# Patient Record
Sex: Female | Born: 1955 | Race: White | Hispanic: No | Marital: Married | Smoking: Never smoker
Health system: Southern US, Community
[De-identification: ages and names within clinical notes are randomized; demographics above are authoritative.]

## PROBLEM LIST (undated history)

## (undated) HISTORY — PX: NO PAST SURGERIES: SHX2092

---

## 2009-09-01 ENCOUNTER — Ambulatory Visit: Payer: Self-pay | Admitting: Internal Medicine

## 2010-11-05 DIAGNOSIS — E039 Hypothyroidism, unspecified: Secondary | ICD-10-CM | POA: Insufficient documentation

## 2010-11-05 DIAGNOSIS — F32A Depression, unspecified: Secondary | ICD-10-CM | POA: Insufficient documentation

## 2010-11-05 DIAGNOSIS — E7800 Pure hypercholesterolemia, unspecified: Secondary | ICD-10-CM | POA: Insufficient documentation

## 2010-11-05 DIAGNOSIS — B009 Herpesviral infection, unspecified: Secondary | ICD-10-CM | POA: Insufficient documentation

## 2012-12-31 ENCOUNTER — Ambulatory Visit: Payer: Self-pay | Admitting: Emergency Medicine

## 2013-09-27 DIAGNOSIS — IMO0002 Reserved for concepts with insufficient information to code with codable children: Secondary | ICD-10-CM | POA: Insufficient documentation

## 2013-10-13 DIAGNOSIS — R739 Hyperglycemia, unspecified: Secondary | ICD-10-CM | POA: Insufficient documentation

## 2014-06-22 DIAGNOSIS — Q6672 Congenital pes cavus, left foot: Secondary | ICD-10-CM | POA: Insufficient documentation

## 2014-06-22 DIAGNOSIS — Q6671 Congenital pes cavus, right foot: Secondary | ICD-10-CM | POA: Insufficient documentation

## 2014-06-22 DIAGNOSIS — M722 Plantar fascial fibromatosis: Secondary | ICD-10-CM | POA: Insufficient documentation

## 2014-06-22 DIAGNOSIS — M79673 Pain in unspecified foot: Secondary | ICD-10-CM | POA: Insufficient documentation

## 2014-06-22 DIAGNOSIS — M7751 Other enthesopathy of right foot: Secondary | ICD-10-CM | POA: Insufficient documentation

## 2014-06-22 DIAGNOSIS — L84 Corns and callosities: Secondary | ICD-10-CM | POA: Insufficient documentation

## 2016-05-07 ENCOUNTER — Ambulatory Visit
Admission: EM | Admit: 2016-05-07 | Discharge: 2016-05-07 | Disposition: A | Discharge status: Home / Self Care | Payer: 59 | Attending: Internal Medicine | Admitting: Internal Medicine

## 2016-05-07 DIAGNOSIS — J40 Bronchitis, not specified as acute or chronic: Secondary | ICD-10-CM

## 2016-05-07 MED ORDER — AZITHROMYCIN 250 MG PO TABS: ORAL_TABLET | ORAL | 0 refills | Status: DC

## 2016-05-07 MED ORDER — HYDROCOD POLST-CPM POLST ER 10-8 MG/5ML PO SUER: 5.0000 mL | Freq: Every evening | ORAL | 0 refills | Status: AC | PRN

## 2016-05-07 MED ORDER — ALBUTEROL SULFATE HFA 108 (90 BASE) MCG/ACT IN AERS: 2.0000 | INHALATION_SPRAY | RESPIRATORY_TRACT | 0 refills | Status: AC | PRN

## 2016-05-07 NOTE — Discharge Instructions (Signed)
Take medication as prescribed. Rest. Drink plenty of fluids.  ° °Follow up with your primary care physician this week. Return to Urgent care for new or worsening concerns.  ° °

## 2016-05-07 NOTE — ED Triage Notes (Signed)
Patient complains of cough x 2 weeks. Patient states that she has also noticed some headaches with sinus pain and pressure.Patient states that she has also noticed some wheezing.

## 2016-05-07 NOTE — ED Provider Notes (Signed)
[facility] ____________________________________________  Time seen: Approximately 9:09 AM  I have reviewed the triage vital signs and the nursing notes.   HISTORY  Chief Complaint Cough   HPI Elizabeth Harrell is a 60 y.o. female presenting for the complaints of 2 weeks of cough. Patient reports approximately 2 weeks ago she started off with some congestion and chest cough. Patient reports that she has had some sinus congestion and runny nose, but reports cough is her biggest complaint. Patient states feels like she has congestion in her chest but unable to cough it up. States cough is primarily a dry hacking cough. States cough is worse at night and wakes her up intermittently. Patient does report occasionally hearing herself wheeze, primarily at night. Denies fevers. Denies known sick contacts. Reports has continued to remain active. Reports continues to eat and drink well.  Denies chest pain, shortness of breath, abdominal pain, dysuria, extremity pain or extremity swelling. Denies cardiac history or renal insufficiency. Denies recent sickness. Reports has continued to stay active as normal. Reports past smoker, not current.   PCP: [facility] No LMP recorded. Patient is postmenopausal.   History reviewed. No pertinent past medical history.  There are no active problems to display for this patient.   Past Surgical History:  Procedure Laterality Date  . NO PAST SURGERIES       No current facility-administered medications for this encounter.   Current Outpatient Prescriptions:  .  DULoxetine (CYMBALTA) 30 MG capsule, Take 30 mg by mouth daily., Disp: , Rfl:  .  levothyroxine (SYNTHROID, LEVOTHROID) 50 MCG tablet, Take 50 mcg by mouth daily before breakfast., Disp: , Rfl:  .  albuterol (PROVENTIL HFA;VENTOLIN HFA) 108 (90 Base) MCG/ACT inhaler, Inhale 2 puffs into the lungs every 4 (four) hours as needed for wheezing or shortness of breath., Disp: 1 Inhaler, Rfl: 0 .   azithromycin (ZITHROMAX Z-PAK) 250 MG tablet, Take 2 tablets (500 mg) on  Day 1,  followed by 1 tablet (250 mg) once daily on Days 2 through 5., Disp: 6 each, Rfl: 0 .  chlorpheniramine-HYDROcodone (TUSSIONEX PENNKINETIC ER) 10-8 MG/5ML SUER, Take 5 mLs by mouth at bedtime as needed for cough. do not drive or operate machinery while taking as can cause drowsiness., Disp: 75 mL, Rfl: 0  Allergies Penicillins  Family history Mother: Lung Cancer  Social History Social History  Substance Use Topics  . Smoking status: Past Smoker  . Smokeless tobacco: Never Used  . Alcohol use No    Review of Systems Constitutional: No fever/chills Eyes: No visual changes. ENT: No sore throat. Cardiovascular: Denies chest pain. Respiratory: Denies shortness of breath. Gastrointestinal: No abdominal pain.  No nausea, no vomiting.  No diarrhea.  No constipation. Genitourinary: Negative for dysuria. Musculoskeletal: Negative for back pain. Skin: Negative for rash. Neurological: Negative for headaches, focal weakness or numbness.  10-point ROS otherwise negative.  ____________________________________________   PHYSICAL EXAM:  VITAL SIGNS: ED Triage Vitals  Enc Vitals Group     BP 05/07/16 0851 (!) 157/83     Pulse Rate 05/07/16 0851 94     Resp 05/07/16 0851 18     Temp 05/07/16 0851 98.7 F (37.1 C)     Temp Source 05/07/16 0851 Oral     SpO2 05/07/16 0851 94 %     Weight 05/07/16 0849 140 lb (63.5 kg)     Height 05/07/16 0849 5\' 3"  (1.6 m)     Head Circumference --      Peak  Flow --      Pain Score 05/07/16 0852 0     Pain Loc --      Pain Edu? --      Excl. in GC? --     Constitutional: Alert and oriented. Well appearing and in no acute distress. Eyes: Conjunctivae are normal. PERRL. EOMI. Head: Atraumatic. No sinus tenderness to palpation. No swelling. No erythema.  Ears: no erythema, normal TMs bilaterally.   Nose:Mild nasal congestion.  Mouth/Throat: Mucous membranes are  moist. No pharyngeal erythema. No tonsillar swelling or exudate.  Neck: No stridor.  No cervical spine tenderness to palpation. Hematological/Lymphatic/Immunilogical: No cervical lymphadenopathy. Cardiovascular: Normal rate, regular rhythm. Grossly normal heart sounds.  Good peripheral circulation. Respiratory: Normal respiratory effort.  No retractions. Mild scattered rhonchi. No wheezes or rales. Dry intermittent cough noted in room with mild bronchospasm wheeze. Good air movement. Speaks in complete sentences. No focal area of consolidation auscultated. Gastrointestinal: Soft and nontender.  Musculoskeletal: Ambulatory with steady gait.  No cervical, thoracic or lumbar tenderness to palpation. Neurologic:  Normal speech and language. No gait instability. Skin:  Skin is warm, dry and intact. No rash noted. Psychiatric: Mood and affect are normal. Speech and behavior are normal. ___________________________________________   LABS (all labs ordered are listed, but only abnormal results are displayed)  Labs Reviewed - No data to display  RADIOLOGY  Patient declined.   PROCEDURES Procedures    INITIAL IMPRESSION / ASSESSMENT AND PLAN / ED COURSE  Pertinent labs & imaging results that were available during my care of the patient were reviewed by me and considered in my medical decision making (see chart for details).   Well-appearing patient. No acute distress. Presents for the complaints of 2 weeks of cough. Patient with mild scattered rhonchi and mild bronchospasm noted with dry cough in room, otherwise lungs clear throughout. No focal area of consolidation auscultated. Patient expresses concern of needing chest x-ray, discussed in detail with patient evaluation of chest x-ray, patient declines. Suspect bronchitis and will treat with oral azithromycin, when necessary albuterol inhaler when necessary Tussionex at night. Patient states that she will then follow-up with her primary care  physician in one week for any continued symptoms and have chest x-ray then. Patient declines chest x-ray at this time. Encourage rest, fluids and supportive care.  [place] controlled substance database reviewed, and no controlled substances documented for patient in last 6 months.   Discussed follow up with Primary care physician this week. Discussed follow up and return parameters including chest pain, shortness of breath, fevers, no resolution or any worsening concerns. Patient verbalized understanding and agreed to plan.   ____________________________________________   FINAL CLINICAL IMPRESSION(S) / ED DIAGNOSES  Final diagnoses:  Bronchitis     Discharge Medication List as of 05/07/2016  9:06 AM    START taking these medications   Details  albuterol (PROVENTIL HFA;VENTOLIN HFA) 108 (90 Base) MCG/ACT inhaler Inhale 2 puffs into the lungs every 4 (four) hours as needed for wheezing or shortness of breath., Starting Wed 05/07/2016, Normal    azithromycin (ZITHROMAX Z-PAK) 250 MG tablet Take 2 tablets (500 mg) on  Day 1,  followed by 1 tablet (250 mg) once daily on Days 2 through 5., Normal    chlorpheniramine-HYDROcodone (TUSSIONEX PENNKINETIC ER) 10-8 MG/5ML SUER Take 5 mLs by mouth at bedtime as needed for cough. do not drive or operate machinery while taking as can cause drowsiness., Starting Wed 05/07/2016, Print        Note:  This dictation was prepared with Dragon dictation along with smaller phrase technology. Any transcriptional errors that result from this process are unintentional.    Clinical Course       Renford Dills, NP 05/07/16 1610    Renford Dills, NP 05/07/16 [phone]

## 2016-05-13 ENCOUNTER — Ambulatory Visit
Admission: RE | Admit: 2016-05-13 | Discharge: 2016-05-13 | Disposition: A | Discharge status: Home / Self Care | Payer: 59 | Source: Ambulatory Visit | Attending: Family Medicine | Admitting: Family Medicine

## 2016-05-13 ENCOUNTER — Other Ambulatory Visit: Payer: Self-pay | Admitting: Family Medicine

## 2016-05-13 DIAGNOSIS — R05 Cough: Secondary | ICD-10-CM | POA: Diagnosis present

## 2016-05-13 DIAGNOSIS — R059 Cough, unspecified: Secondary | ICD-10-CM

## 2016-05-13 DIAGNOSIS — J410 Simple chronic bronchitis: Secondary | ICD-10-CM | POA: Diagnosis not present

## 2016-10-21 DIAGNOSIS — M21961 Unspecified acquired deformity of right lower leg: Secondary | ICD-10-CM | POA: Insufficient documentation

## 2016-10-21 DIAGNOSIS — M2060 Acquired deformities of toe(s), unspecified, unspecified foot: Secondary | ICD-10-CM | POA: Insufficient documentation

## 2017-05-18 ENCOUNTER — Ambulatory Visit
Admission: RE | Admit: 2017-05-18 | Discharge: 2017-05-18 | Disposition: A | Discharge status: Home / Self Care | Payer: 59 | Source: Ambulatory Visit | Attending: Family Medicine | Admitting: Family Medicine

## 2017-05-18 ENCOUNTER — Other Ambulatory Visit: Payer: Self-pay | Admitting: Family Medicine

## 2017-05-18 ENCOUNTER — Other Ambulatory Visit (HOSPITAL_COMMUNITY): Payer: Self-pay | Admitting: Family Medicine

## 2017-05-18 DIAGNOSIS — J189 Pneumonia, unspecified organism: Secondary | ICD-10-CM

## 2017-05-18 DIAGNOSIS — J181 Lobar pneumonia, unspecified organism: Secondary | ICD-10-CM | POA: Insufficient documentation

## 2017-08-10 DIAGNOSIS — M7661 Achilles tendinitis, right leg: Secondary | ICD-10-CM | POA: Insufficient documentation

## 2017-08-10 DIAGNOSIS — G8929 Other chronic pain: Secondary | ICD-10-CM | POA: Insufficient documentation

## 2017-08-10 DIAGNOSIS — M79671 Pain in right foot: Secondary | ICD-10-CM | POA: Insufficient documentation

## 2017-08-10 DIAGNOSIS — L909 Atrophic disorder of skin, unspecified: Secondary | ICD-10-CM | POA: Insufficient documentation

## 2018-10-20 IMAGING — CR DG CHEST 2V
2 series · 2 of 2 positions shown · non-contrast
Comparison: 05/13/2016

CLINICAL DATA: Left lower lobe pneumonia

EXAM:
CHEST  2 VIEW

[chest pa]
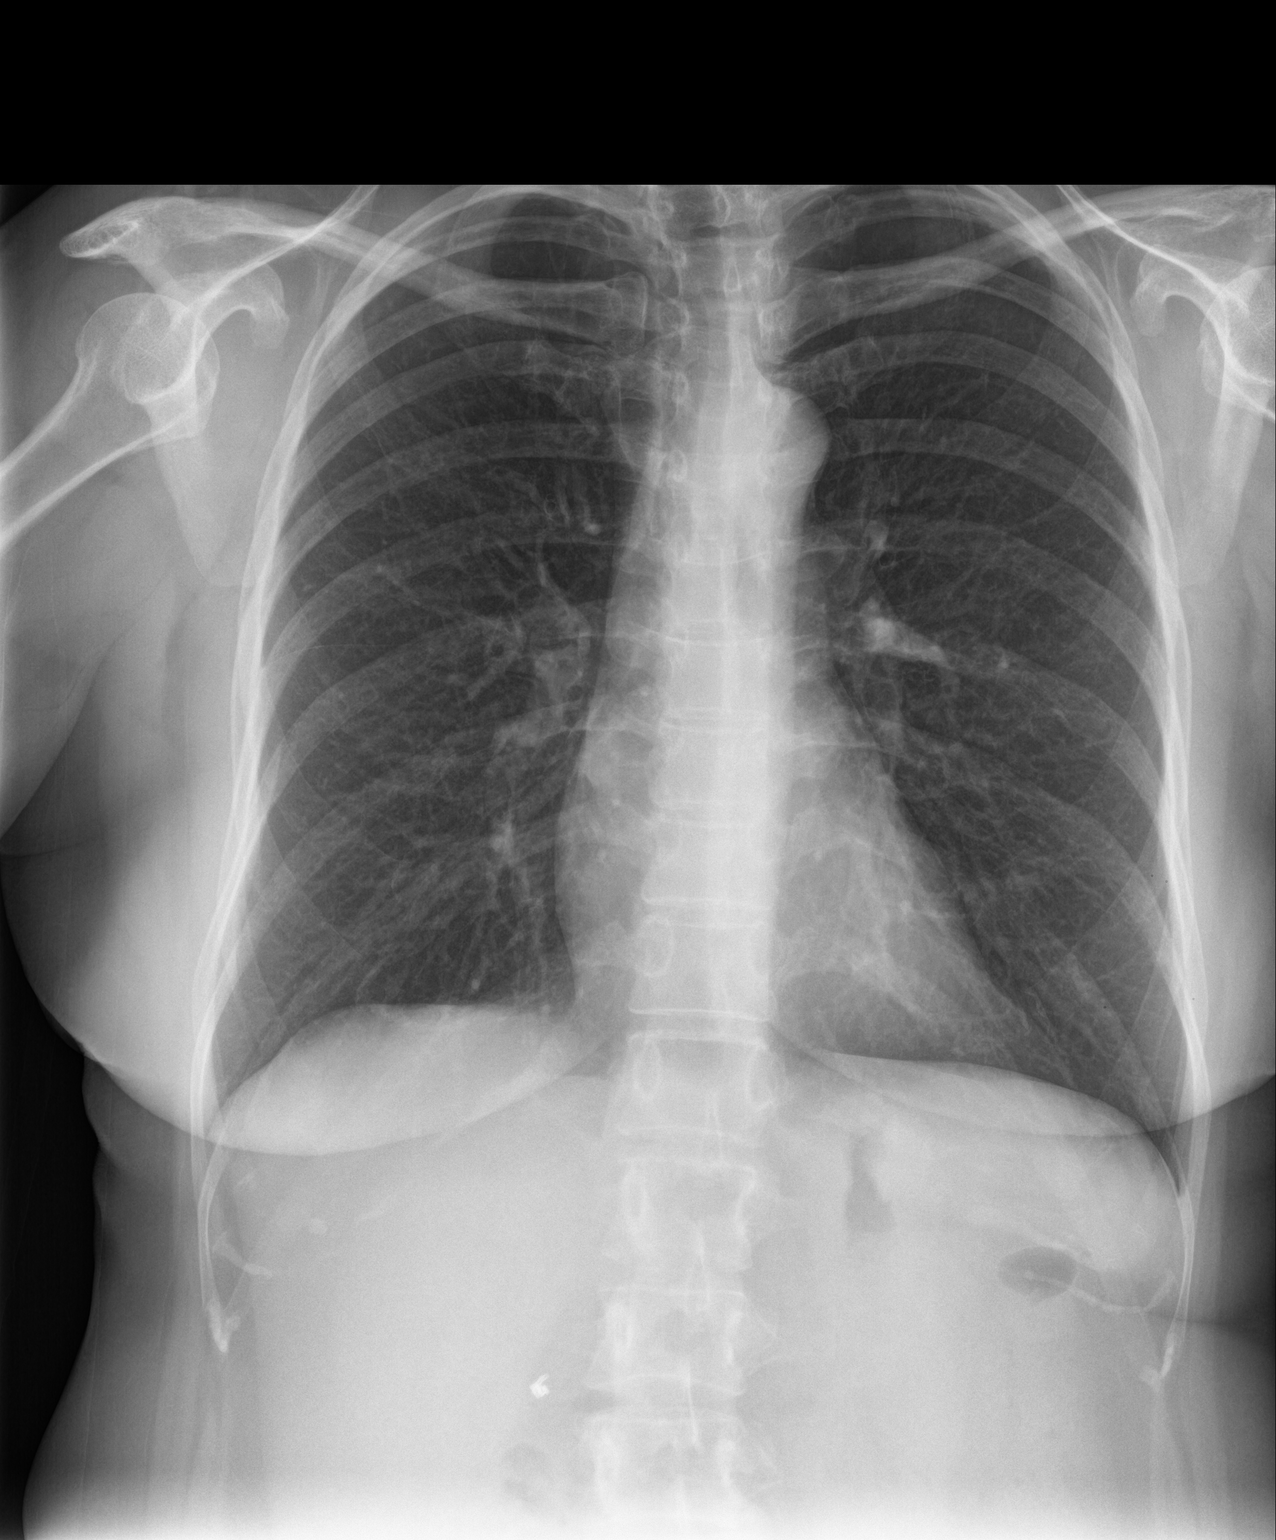

[chest lat]
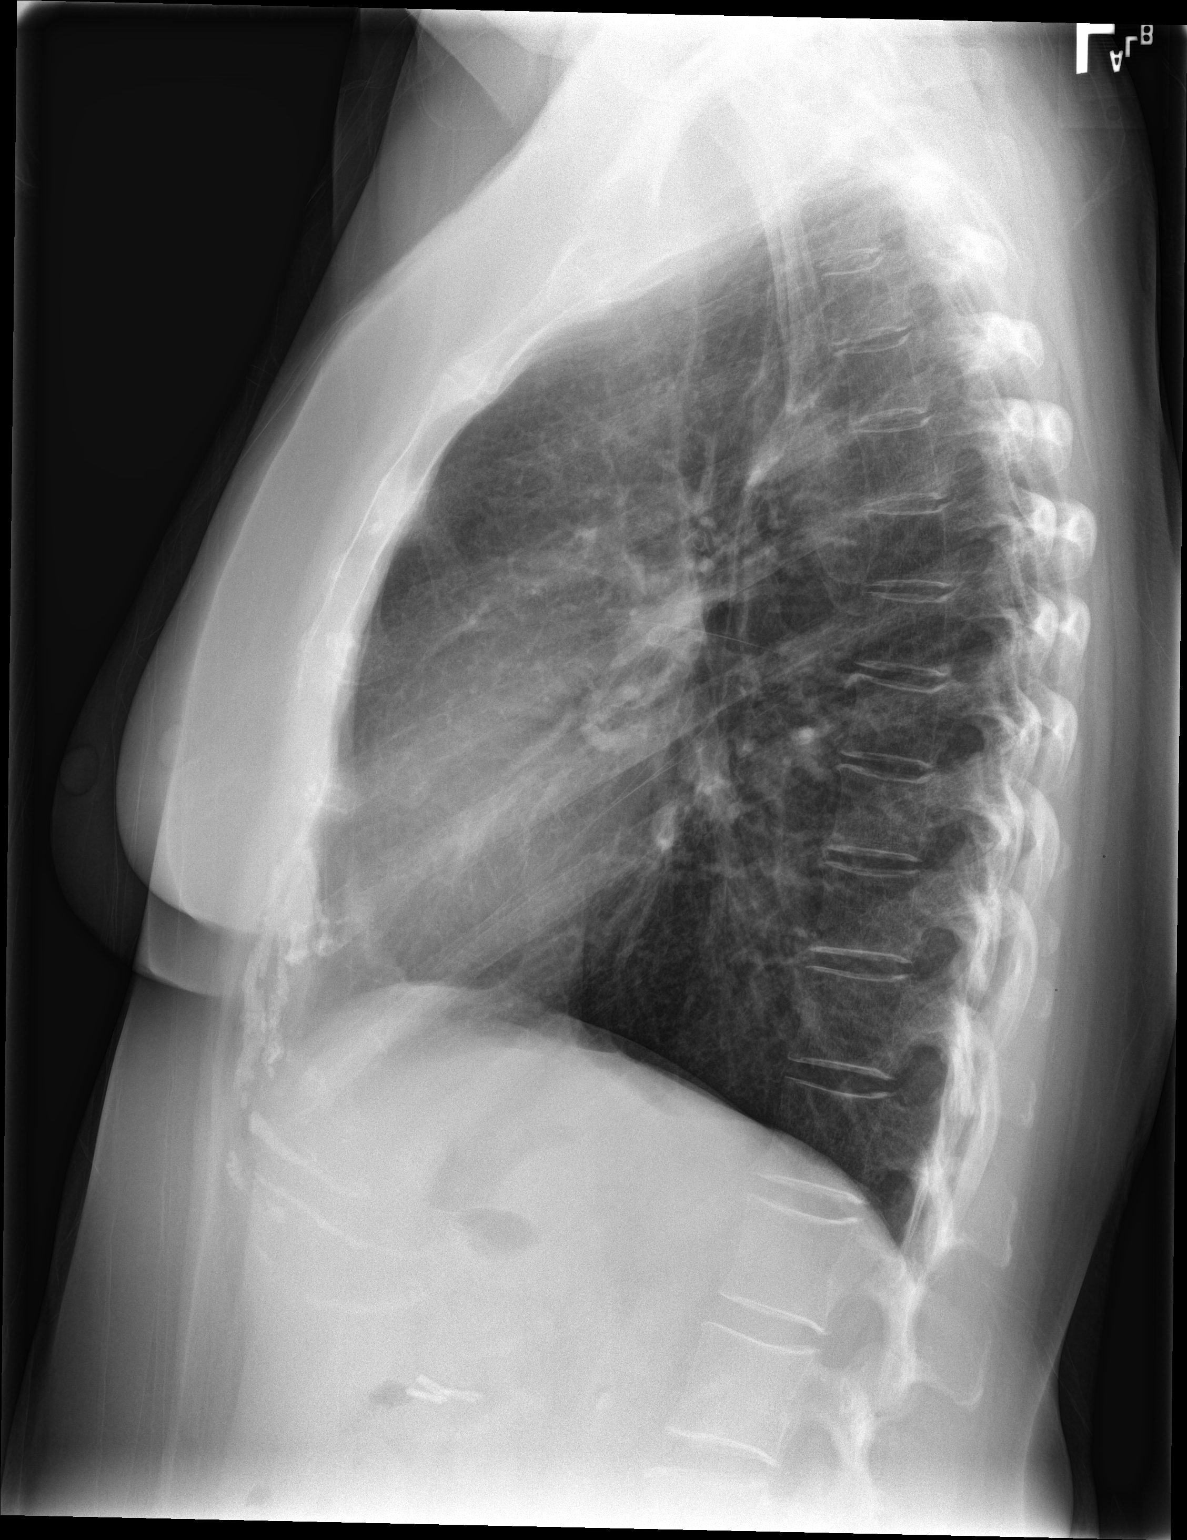

[2 of 2 positions shown; findings below may reference images not displayed]

FINDINGS: Heart and mediastinal contours are within normal limits. No focal
opacities or effusions. No acute bony abnormality.
IMPRESSION: No active cardiopulmonary disease.

## 2019-09-19 DIAGNOSIS — L909 Atrophic disorder of skin, unspecified: Secondary | ICD-10-CM | POA: Insufficient documentation

## 2019-09-19 DIAGNOSIS — S93144A Subluxation of metatarsophalangeal joint of right lesser toe(s), initial encounter: Secondary | ICD-10-CM | POA: Insufficient documentation

## 2019-09-19 DIAGNOSIS — R2241 Localized swelling, mass and lump, right lower limb: Secondary | ICD-10-CM | POA: Insufficient documentation

## 2020-04-09 DIAGNOSIS — I10 Essential (primary) hypertension: Secondary | ICD-10-CM | POA: Insufficient documentation

## 2020-05-14 ENCOUNTER — Other Ambulatory Visit: Payer: Self-pay

## 2020-05-14 ENCOUNTER — Ambulatory Visit (INDEPENDENT_AMBULATORY_CARE_PROVIDER_SITE_OTHER): Payer: 59 | Admitting: Vascular Surgery

## 2020-05-14 ENCOUNTER — Encounter (INDEPENDENT_AMBULATORY_CARE_PROVIDER_SITE_OTHER): Payer: Self-pay | Admitting: Vascular Surgery

## 2020-05-14 VITALS — BP 167/78 | HR 80 | Ht 63.0 in | Wt 144.0 lb

## 2020-05-14 DIAGNOSIS — I83813 Varicose veins of bilateral lower extremities with pain: Secondary | ICD-10-CM | POA: Diagnosis not present

## 2020-05-14 DIAGNOSIS — I872 Venous insufficiency (chronic) (peripheral): Secondary | ICD-10-CM | POA: Diagnosis not present

## 2020-05-14 DIAGNOSIS — I10 Essential (primary) hypertension: Secondary | ICD-10-CM

## 2020-05-14 DIAGNOSIS — E7800 Pure hypercholesterolemia, unspecified: Secondary | ICD-10-CM

## 2020-05-16 ENCOUNTER — Encounter (INDEPENDENT_AMBULATORY_CARE_PROVIDER_SITE_OTHER): Payer: Self-pay | Admitting: Vascular Surgery

## 2020-05-16 DIAGNOSIS — I83813 Varicose veins of bilateral lower extremities with pain: Secondary | ICD-10-CM | POA: Insufficient documentation

## 2020-05-16 DIAGNOSIS — I872 Venous insufficiency (chronic) (peripheral): Secondary | ICD-10-CM | POA: Insufficient documentation

## 2020-05-16 NOTE — Progress Notes (Signed)
MRN : [id number]  Elizabeth Harrell is a 65 y.o. ([birth year]) female who presents with chief complaint of  Chief Complaint  Patient presents with  . New Patient (Initial Visit)    [facility]  vein disorder   .  History of Present Illness:   The patient is seen for evaluation of symptomatic varicose veins. The patient relates burning and stinging which worsened steadily throughout the course of the day, particularly with standing. The patient also notes an aching and throbbing pain over the varicosities, particularly with prolonged dependent positions. The symptoms are significantly improved with elevation.  The patient also notes that during hot weather the symptoms are greatly intensified. The patient states the pain from the varicose veins interferes with work, daily exercise, shopping and household maintenance. At this point, the symptoms are persistent and severe enough that they're having a negative impact on lifestyle and are interfering with daily activities.  There is no history of DVT, PE or superficial thrombophlebitis. There is no history of ulceration or hemorrhage. The patient denies a significant family history of varicose veins.  The patient has not worn graduated compression in the past. At the present time the patient has not been using over-the-counter analgesics. There is no history of prior surgical intervention or sclerotherapy.    Current Meds  Medication Sig  . albuterol (PROVENTIL HFA;VENTOLIN HFA) 108 (90 Base) MCG/ACT inhaler Inhale 2 puffs into the lungs every 4 (four) hours as needed for wheezing or shortness of breath.  Marland Kitchen azithromycin (ZITHROMAX Z-PAK) 250 MG tablet Take 2 tablets (500 mg) on  Day 1,  followed by 1 tablet (250 mg) once daily on Days 2 through 5.  . chlorpheniramine-HYDROcodone (TUSSIONEX PENNKINETIC ER) 10-8 MG/5ML SUER Take 5 mLs by mouth at bedtime as needed for cough. do not drive or operate machinery while taking as can cause drowsiness.  .  Cholecalciferol 125 MCG (5000 UT) TABS Take by mouth.  . cyanocobalamin (,VITAMIN B-12,) 1000 MCG/ML injection Inject into the muscle.  . DULoxetine (CYMBALTA) 30 MG capsule Take 30 mg by mouth daily.  Marland Kitchen levothyroxine (SYNTHROID, LEVOTHROID) 50 MCG tablet Take 50 mcg by mouth daily before breakfast.  . losartan (COZAAR) 25 MG tablet Take 25 mg by mouth daily.  . metFORMIN (GLUCOPHAGE) 500 MG tablet Take by mouth.  . valACYclovir (VALTREX) 500 MG tablet Take 1 tablet by mouth 2 (two) times daily.    No past medical history on file.  Past Surgical History:  Procedure Laterality Date  . NO PAST SURGERIES      Social History Social History   Tobacco Use  . Smoking status: Never Smoker  . Smokeless tobacco: Never Used  Substance Use Topics  . Alcohol use: No  . Drug use: No    Family History No family history of bleeding/clotting disorders, porphyria or autoimmune disease   Allergies  Allergen Reactions  . Nitrofurantoin Hives  . Penicillins Rash     REVIEW OF SYSTEMS (Negative unless checked)  Constitutional: [] Weight loss  [] Fever  [] Chills Cardiac: [] Chest pain   [] Chest pressure   [] Palpitations   [] Shortness of breath when laying flat   [] Shortness of breath with exertion. Vascular:  [] Pain in legs with walking   [x] Pain in legs at rest  [] History of DVT   [] Phlebitis   [] Swelling in legs   [x] Varicose veins   [] Non-healing ulcers Pulmonary:   [] Uses home oxygen   [] Productive cough   [] Hemoptysis   [] Wheeze  [] COPD   []   Asthma Neurologic:  [] Dizziness   [] Seizures   [] History of stroke   [] History of TIA  [] Aphasia   [] Vissual changes   [] Weakness or numbness in arm   [] Weakness or numbness in leg Musculoskeletal:   [] Joint swelling   [x] Joint pain   [] Low back pain Hematologic:  [] Easy bruising  [] Easy bleeding   [] Hypercoagulable state   [] Anemic Gastrointestinal:  [] Diarrhea   [] Vomiting  [] Gastroesophageal reflux/heartburn   [] Difficulty swallowing. Genitourinary:   [] Chronic kidney disease   [] Difficult urination  [] Frequent urination   [] Blood in urine Skin:  [] Rashes   [] Ulcers  Psychological:  [] History of anxiety   []  History of major depression.  Physical Examination  Vitals:   05/14/20 1617  BP: (!) 167/78  Pulse: 80  Weight: 144 lb (65.3 kg)  Height: 5\' 3"  (1.6 m)   Body mass index is 25.51 kg/m. Gen: WD/WN, NAD Head: [place]/AT, No temporalis wasting.  Ear/Nose/Throat: Hearing grossly intact, nares w/o erythema or drainage, poor dentition Eyes: PER, EOMI, sclera nonicteric.  Neck: Supple, no masses.  No bruit or JVD.  Pulmonary:  Good air movement, clear to auscultation bilaterally, no use of accessory muscles.  Cardiac: RRR, normal S1, S2, no Murmurs. Vascular: scattered varicosities present bilaterally.  Mild venous stasis changes to the legs bilaterally.  2+ soft pitting edema Vessel Right Left  Radial Palpable Palpable  PT Palpable Palpable  DP Palpable Palpable  Gastrointestinal: soft, non-distended. No guarding/no peritoneal signs.  Musculoskeletal: M/S 5/5 throughout.  No deformity or atrophy.  Neurologic: CN 2-12 intact. Pain and light touch intact in extremities.  Symmetrical.  Speech is fluent. Motor exam as listed above. Psychiatric: Judgment intact, Mood & affect appropriate for pt's clinical situation. Dermatologic: Mild venous rashes no ulcers noted.  No changes consistent with cellulitis.  CBC No results found for: WBC, HGB, HCT, MCV, PLT  BMET No results found for: NA, K, CL, CO2, GLUCOSE, BUN, CREATININE, CALCIUM, GFRNONAA, GFRAA CrCl cannot be calculated (No successful lab value found.).  COAG No results found for: INR, PROTIME  Radiology No results found.   Assessment/Plan 1. Varicose veins of both lower extremities with pain  Recommend:  The patient has large symptomatic varicose veins that are painful and associated with swelling.  I have had a long discussion with the patient regarding  varicose  veins and why they cause symptoms.  Patient will begin wearing graduated compression stockings class 1 on a daily basis, beginning first thing in the morning and removing them in the evening. The patient is instructed specifically not to sleep in the stockings.    The patient  will also begin using over-the-counter analgesics such as Motrin 600 mg po TID to help control the symptoms.    In addition, behavioral modification including elevation during the day will be initiated.    Pending the results of these changes the  patient will be reevaluated in three months.   An  ultrasound of the venous system will be obtained.   Further plans will be based on the ultrasound results and whether conservative therapies are successful at eliminating the pain and swelling.   2. Chronic venous insufficiency  Recommend:  The patient has large symptomatic varicose veins that are painful and associated with swelling.  I have had a long discussion with the patient regarding  varicose veins and why they cause symptoms.  Patient will begin wearing graduated compression stockings class 1 on a daily basis, beginning first thing in the morning and removing them  in the evening. The patient is instructed specifically not to sleep in the stockings.    The patient  will also begin using over-the-counter analgesics such as Motrin 600 mg po TID to help control the symptoms.    In addition, behavioral modification including elevation during the day will be initiated.    Pending the results of these changes the  patient will be reevaluated in three months.   An  ultrasound of the venous system will be obtained.   Further plans will be based on the ultrasound results and whether conservative therapies are successful at eliminating the pain and swelling.   3. Essential hypertension Continue antihypertensive medications as already ordered, these medications have been reviewed and there are no changes at this time.   4.  Hypercholesterolemia Continue statin as ordered and reviewed, no changes at this time     Hortencia Pilar, MD  05/16/2020 8:38 AM

## 2020-06-28 ENCOUNTER — Ambulatory Visit (INDEPENDENT_AMBULATORY_CARE_PROVIDER_SITE_OTHER): Payer: 59 | Admitting: Vascular Surgery

## 2020-08-20 ENCOUNTER — Ambulatory Visit (INDEPENDENT_AMBULATORY_CARE_PROVIDER_SITE_OTHER): Payer: 59 | Admitting: Vascular Surgery

## 2020-08-20 ENCOUNTER — Encounter (INDEPENDENT_AMBULATORY_CARE_PROVIDER_SITE_OTHER): Payer: 59

## 2020-08-23 ENCOUNTER — Other Ambulatory Visit (INDEPENDENT_AMBULATORY_CARE_PROVIDER_SITE_OTHER): Payer: Self-pay | Admitting: Vascular Surgery

## 2020-08-23 DIAGNOSIS — I83813 Varicose veins of bilateral lower extremities with pain: Secondary | ICD-10-CM

## 2020-08-23 DIAGNOSIS — I872 Venous insufficiency (chronic) (peripheral): Secondary | ICD-10-CM

## 2020-08-27 ENCOUNTER — Other Ambulatory Visit: Payer: Self-pay

## 2020-08-27 ENCOUNTER — Ambulatory Visit (INDEPENDENT_AMBULATORY_CARE_PROVIDER_SITE_OTHER): Payer: 59

## 2020-08-27 ENCOUNTER — Ambulatory Visit (INDEPENDENT_AMBULATORY_CARE_PROVIDER_SITE_OTHER): Payer: 59 | Admitting: Vascular Surgery

## 2020-08-27 ENCOUNTER — Encounter (INDEPENDENT_AMBULATORY_CARE_PROVIDER_SITE_OTHER): Payer: Self-pay | Admitting: Vascular Surgery

## 2020-08-27 VITALS — BP 139/81 | HR 59 | Ht 63.0 in | Wt 137.0 lb

## 2020-08-27 DIAGNOSIS — I872 Venous insufficiency (chronic) (peripheral): Secondary | ICD-10-CM

## 2020-08-27 DIAGNOSIS — I83813 Varicose veins of bilateral lower extremities with pain: Secondary | ICD-10-CM

## 2020-08-27 DIAGNOSIS — E7800 Pure hypercholesterolemia, unspecified: Secondary | ICD-10-CM

## 2020-08-27 DIAGNOSIS — I10 Essential (primary) hypertension: Secondary | ICD-10-CM

## 2020-08-27 NOTE — Progress Notes (Signed)
MRN : [id number]  Elizabeth Harrell is a 65 y.o. ([birth year]) female who presents with chief complaint of No chief complaint on file. Marland Kitchen  History of Present Illness:   The patient returns for followup evaluation 3 months after the initial visit. The patient continues to have pain in the lower extremities with dependency. The pain is lessened with elevation. Graduated compression stockings, Class I (20-30 mmHg), have been worn but the stockings do not eliminate the leg pain. Over-the-counter analgesics do not improve the symptoms. The degree of discomfort continues to interfere with daily activities. The patient notes the pain in the legs is causing problems with daily exercise, at the workplace and even with household activities and maintenance such as standing in the kitchen preparing meals and doing dishes.   Venous ultrasound shows normal deep venous system, no evidence of acute or chronic DVT.  Superficial reflux is present in the right great saphenous vein  No outpatient medications have been marked as taking for the 08/27/20 encounter (Appointment) with Gilda Crease, Latina Craver, MD.    No past medical history on file.  Past Surgical History:  Procedure Laterality Date  . NO PAST SURGERIES      Social History Social History   Tobacco Use  . Smoking status: Never Smoker  . Smokeless tobacco: Never Used  Substance Use Topics  . Alcohol use: No  . Drug use: No    Family History No family history on file.  Allergies  Allergen Reactions  . Nitrofurantoin Hives  . Penicillins Rash     REVIEW OF SYSTEMS (Negative unless checked)  Constitutional: [] Weight loss  [] Fever  [] Chills Cardiac: [] Chest pain   [] Chest pressure   [] Palpitations   [] Shortness of breath when laying flat   [] Shortness of breath with exertion. Vascular:  [] Pain in legs with walking   [x] Pain in legs at rest  [] History of DVT   [] Phlebitis   [x] Swelling in legs   [x] Varicose veins   [] Non-healing  ulcers Pulmonary:   [] Uses home oxygen   [] Productive cough   [] Hemoptysis   [] Wheeze  [] COPD   [] Asthma Neurologic:  [] Dizziness   [] Seizures   [] History of stroke   [] History of TIA  [] Aphasia   [] Vissual changes   [] Weakness or numbness in arm   [] Weakness or numbness in leg Musculoskeletal:   [] Joint swelling   [] Joint pain   [] Low back pain Hematologic:  [] Easy bruising  [] Easy bleeding   [] Hypercoagulable state   [] Anemic Gastrointestinal:  [] Diarrhea   [] Vomiting  [] Gastroesophageal reflux/heartburn   [] Difficulty swallowing. Genitourinary:  [] Chronic kidney disease   [] Difficult urination  [] Frequent urination   [] Blood in urine Skin:  [] Rashes   [] Ulcers  Psychological:  [] History of anxiety   []  History of major depression.  Physical Examination  There were no vitals filed for this visit. There is no height or weight on file to calculate BMI. Gen: WD/WN, NAD Head: [place]/AT, No temporalis wasting.  Ear/Nose/Throat: Hearing grossly intact, nares w/o erythema or drainage Eyes: PER, EOMI, sclera nonicteric.  Neck: Supple, no large masses.   Pulmonary:  Good air movement, no audible wheezing bilaterally, no use of accessory muscles.  Cardiac: RRR, no JVD Vascular: Large varicosities right medial calf >10 mm.  Mild venous stasis changes to the legs bilaterally.  2+ soft pitting edema Vessel Right Left  Radial Palpable Palpable  PT Palpable Palpable  DP Palpable Palpable  Gastrointestinal: Non-distended. No guarding/no peritoneal signs.  Musculoskeletal: M/S 5/5 throughout.  No deformity or atrophy.  Neurologic: CN 2-12 intact. Symmetrical.  Speech is fluent. Motor exam as listed above. Psychiatric: Judgment intact, Mood & affect appropriate for pt's clinical situation. Dermatologic: Mild venous rashes no ulcers noted.  No changes consistent with cellulitis. Lymph : No lichenification or skin changes of chronic lymphedema.  CBC No results found for: WBC, HGB, HCT, MCV,  PLT  BMET No results found for: NA, K, CL, CO2, GLUCOSE, BUN, CREATININE, CALCIUM, GFRNONAA, GFRAA CrCl cannot be calculated (No successful lab value found.).  COAG No results found for: INR, PROTIME  Radiology No results found.   Assessment/Plan 1. Varicose veins of both lower extremities with pain Recommend  I have reviewed my previous  discussion with the patient regarding  varicose veins and why they cause symptoms. Patient will continue  wearing graduated compression stockings class 1 on a daily basis, beginning first thing in the morning and removing them in the evening.    In addition, behavioral modification including elevation during the day was again discussed and this will continue.  The patient has utilized over the counter pain medications and has been exercising.  However, at this time conservative therapy has not alleviated the patient's symptoms of leg pain and swelling  Recommend: laser ablation of the right great saphenous vein to eliminate the symptoms of pain and swelling of the lower extremities caused by the severe superficial venous reflux disease.   2. Chronic venous insufficiency No surgery or intervention at this point in time.    I have had a long discussion with the patient regarding venous insufficiency and why it  causes symptoms. I have discussed with the patient the chronic skin changes that accompany venous insufficiency and the long term sequela such as infection and ulceration.  Patient will begin wearing graduated compression stockings class 1 (20-30 mmHg) or compression wraps on a daily basis a prescription was given. The patient will put the stockings on first thing in the morning and removing them in the evening. The patient is instructed specifically not to sleep in the stockings.    In addition, behavioral modification including several periods of elevation of the lower extremities during the day will be continued. I have demonstrated that  proper elevation is a position with the ankles at heart level.  The patient is instructed to begin routine exercise, especially walking on a daily basis  3. Essential hypertension Continue antihypertensive medications as already ordered, these medications have been reviewed and there are no changes at this time.   4. Hypercholesterolemia Continue statin as ordered and reviewed, no changes at this time   Levora Dredge, MD  08/27/2020 9:40 AM

## 2020-11-08 ENCOUNTER — Ambulatory Visit
Admission: EM | Admit: 2020-11-08 | Discharge: 2020-11-08 | Disposition: A | Discharge status: Home / Self Care | Payer: Medicare HMO | Attending: Sports Medicine | Admitting: Sports Medicine

## 2020-11-08 ENCOUNTER — Encounter: Payer: Self-pay | Admitting: Emergency Medicine

## 2020-11-08 ENCOUNTER — Other Ambulatory Visit: Payer: Self-pay

## 2020-11-08 DIAGNOSIS — Z20822 Contact with and (suspected) exposure to covid-19: Secondary | ICD-10-CM | POA: Insufficient documentation

## 2020-11-08 DIAGNOSIS — Z87440 Personal history of urinary (tract) infections: Secondary | ICD-10-CM | POA: Insufficient documentation

## 2020-11-08 DIAGNOSIS — Z79899 Other long term (current) drug therapy: Secondary | ICD-10-CM | POA: Diagnosis not present

## 2020-11-08 DIAGNOSIS — R059 Cough, unspecified: Secondary | ICD-10-CM | POA: Insufficient documentation

## 2020-11-08 DIAGNOSIS — R0982 Postnasal drip: Secondary | ICD-10-CM | POA: Diagnosis not present

## 2020-11-08 DIAGNOSIS — N3001 Acute cystitis with hematuria: Secondary | ICD-10-CM | POA: Insufficient documentation

## 2020-11-08 DIAGNOSIS — Z7984 Long term (current) use of oral hypoglycemic drugs: Secondary | ICD-10-CM | POA: Diagnosis not present

## 2020-11-08 DIAGNOSIS — Z881 Allergy status to other antibiotic agents status: Secondary | ICD-10-CM | POA: Diagnosis not present

## 2020-11-08 DIAGNOSIS — Z880 Allergy status to penicillin: Secondary | ICD-10-CM | POA: Insufficient documentation

## 2020-11-08 DIAGNOSIS — R5383 Other fatigue: Secondary | ICD-10-CM | POA: Insufficient documentation

## 2020-11-08 DIAGNOSIS — M791 Myalgia, unspecified site: Secondary | ICD-10-CM | POA: Diagnosis not present

## 2020-11-08 DIAGNOSIS — R300 Dysuria: Secondary | ICD-10-CM | POA: Diagnosis not present

## 2020-11-08 LAB — URINALYSIS, COMPLETE (UACMP) WITH MICROSCOPIC
Bilirubin Urine: NEGATIVE
Glucose, UA: NEGATIVE mg/dL
Ketones, ur: NEGATIVE mg/dL
Nitrite: NEGATIVE
Protein, ur: 100 mg/dL — AB
Specific Gravity, Urine: 1.015 (ref 1.005–1.030)
WBC, UA: >50 WBC/hpf (ref 0–5)
pH: 5.5 (ref 5.0–8.0)

## 2020-11-08 LAB — RESP PANEL BY RT-PCR (FLU A&B, COVID) ARPGX2
Influenza A by PCR: NEGATIVE
Influenza B by PCR: NEGATIVE
SARS Coronavirus 2 by RT PCR: NEGATIVE

## 2020-11-08 MED ORDER — CEPHALEXIN 500 MG PO CAPS: 500.0000 mg | ORAL_CAPSULE | Freq: Two times a day (BID) | ORAL | 0 refills | Status: AC

## 2020-11-08 NOTE — ED Provider Notes (Signed)
[facility]    CSN: [id number] Arrival date & time: 11/08/20  1044      History   Chief Complaint Chief Complaint  Patient presents with   Dysuria   Cough    HPI Elizabeth Harrell is a 65 y.o. female presenting for approximately 2-day history of, feeling feverish, body aches, postnasal drainage, cough, and headache.  Patient states that she was at a large work conference earlier time that her symptoms began and is unsure if she could have been exposed to someone who is ill.  Denies any known COVID exposure.  Vaccinated for COVID-19 x3.  Has not been taking any OTC meds for her symptoms.  Additionally, patient states she has had dysuria and urinary frequency for the past 2 days.  Denies abdominal/suprapubic pain, back pain, hematuria.  Patient states that she has had UTIs in the past and believes that her symptoms are consistent with a UTI today.  Patient has no other complaints or concerns today.  HPI  History reviewed. No pertinent past medical history.  Patient Active Problem List   Diagnosis Date Noted   Varicose veins of both lower extremities with pain 05/16/2020   Chronic venous insufficiency 05/16/2020   Essential hypertension 04/09/2020   Fat pad atrophy of foot 09/19/2019   Mass of right foot 09/19/2019   Subluxation of metatarsophalangeal joint of lesser toe of right foot 09/19/2019   Achilles tendinitis of right lower extremity 08/10/2017   Plantar fat pad atrophy 08/10/2017   Chronic pain of right heel 08/10/2017   Acquired deformities of toe 10/21/2016   Metatarsal deformity, right 10/21/2016   Plantar fasciitis of right foot 06/22/2014   Calcaneal bursitis, right 06/22/2014   Pain in foot 06/22/2014   Cavus deformity of left foot 06/22/2014   Cavus deformity of right foot 06/22/2014   Corn or callus 06/22/2014   Hyperglycemia 10/13/2013   Cystocele 09/27/2013   Acquired hypothyroidism 11/05/2010   Depression 11/05/2010   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      Home Medications    Prior to Admission medications   Medication Sig Start Date End Date Taking? Authorizing Provider  albuterol (PROVENTIL HFA;VENTOLIN HFA) 108 (90 Base) MCG/ACT inhaler Inhale 2 puffs into the lungs every 4 (four) hours as needed for wheezing or shortness of breath. 05/07/16  Yes Renford DillsMiller, Lindsey, NP  cephALEXin (KEFLEX) 500 MG capsule Take 1 capsule (500 mg total) by mouth 2 (two) times daily for 7 days. 11/08/20 11/15/20 Yes Shirlee LatchEaves, Vikash Nest B, PA-C  Cholecalciferol 125 MCG (5000 UT) TABS Take by mouth. 09/21/19  Yes [provider]  cyanocobalamin (,VITAMIN B-12,) 1000 MCG/ML injection Inject into the muscle. 05/14/20 05/09/21 Yes [provider]  DULoxetine (CYMBALTA) 30 MG capsule Take 30 mg by mouth daily.   Yes [provider]  levothyroxine (SYNTHROID, LEVOTHROID) 50 MCG tablet Take 50 mcg by mouth daily before breakfast.   Yes [provider]  losartan (COZAAR) 50 MG tablet Take 1 tablet by mouth daily. 05/17/20  Yes [provider]  metFORMIN (GLUCOPHAGE) 500 MG tablet Take by mouth. 04/30/20  Yes [provider]  triamcinolone ointment (KENALOG) 0.5 % Apply topically 2 (two) times daily. 08/23/20 08/23/21 Yes [provider]  valACYclovir (VALTREX) 500 MG tablet Take 1 tablet by mouth 2 (two)  times daily. 04/24/20  Yes [provider]  chlorpheniramine-HYDROcodone (TUSSIONEX PENNKINETIC ER) 10-8 MG/5ML SUER Take 5 mLs by mouth at bedtime as needed for cough. do not drive or operate machinery while taking as can cause drowsiness. 05/07/16   Renford Dills, NP  losartan (COZAAR) 25 MG tablet Take 25 mg by mouth daily. 03/21/20   [provider]    Family History No family history on file.  Social History Social History    Tobacco Use   Smoking status: Never   Smokeless tobacco: Never  Vaping Use   Vaping Use: Never used  Substance Use Topics   Alcohol use: No   Drug use: No     Allergies   Nitrofurantoin and Penicillins   Review of Systems Review of Systems  Constitutional:  Positive for chills and fatigue. Negative for diaphoresis and fever.  HENT:  Positive for congestion and postnasal drip. Negative for ear pain, rhinorrhea, sinus pressure, sinus pain and sore throat.   Respiratory:  Positive for cough. Negative for shortness of breath.   Cardiovascular:  Negative for chest pain.  Gastrointestinal:  Negative for abdominal pain, diarrhea, nausea and vomiting.  Genitourinary:  Positive for dysuria, frequency and urgency. Negative for decreased urine volume, flank pain, hematuria, pelvic pain, vaginal bleeding, vaginal discharge and vaginal pain.  Musculoskeletal:  Negative for arthralgias, back pain and myalgias.  Skin:  Negative for rash.  Neurological:  Positive for headaches. Negative for weakness.  Hematological:  Negative for adenopathy.    Physical Exam Triage Vital Signs ED Triage Vitals  Enc Vitals Group     BP 11/08/20 1101 131/76     Pulse Rate 11/08/20 1101 98     Resp 11/08/20 1101 18     Temp 11/08/20 1101 99.2 F (37.3 C)     Temp Source 11/08/20 1101 Oral     SpO2 11/08/20 1101 95 %     Weight 11/08/20 1058 136 lb 14.5 oz (62.1 kg)     Height 11/08/20 1058 5\' 3"  (1.6 m)     Head Circumference --      Peak Flow --      Pain Score 11/08/20 1056 3     Pain Loc --      Pain Edu? --      Excl. in GC? --    No data found.  Updated Vital Signs BP 131/76 (BP Location: Left Arm)   Pulse 98   Temp 99.2 F (37.3 C) (Oral)   Resp 18   Ht 5\' 3"  (1.6 m)   Wt 136 lb 14.5 oz (62.1 kg)   SpO2 95%   BMI 24.25 kg/m       Physical Exam Vitals and nursing note reviewed.  Constitutional:      General: She is not in acute distress.    Appearance: Normal appearance. She  is not ill-appearing or toxic-appearing.  HENT:     Head: Normocephalic and atraumatic.     Nose: Nose normal.     Mouth/Throat:     Mouth: Mucous membranes are moist.     Pharynx: Oropharynx is clear. Posterior oropharyngeal erythema (mild with clear PND) present.  Eyes:     General: No scleral icterus.       Right eye: No discharge.        Left eye: No discharge.     Conjunctiva/sclera: Conjunctivae normal.  Cardiovascular:     Rate and Rhythm: Normal rate and regular rhythm.     Heart sounds:  Normal heart sounds.  Pulmonary:     Effort: Pulmonary effort is normal. No respiratory distress.     Breath sounds: Normal breath sounds.  Abdominal:     Palpations: Abdomen is soft.     Tenderness: There is no abdominal tenderness. There is no right CVA tenderness or left CVA tenderness.  Musculoskeletal:     Cervical back: Neck supple.  Skin:    General: Skin is dry.  Neurological:     General: No focal deficit present.     Mental Status: She is alert. Mental status is at baseline.     Motor: No weakness.     Gait: Gait normal.  Psychiatric:        Mood and Affect: Mood normal.        Behavior: Behavior normal.        Thought Content: Thought content normal.     UC Treatments / Results  Labs (all labs ordered are listed, but only abnormal results are displayed) Labs Reviewed  URINALYSIS, COMPLETE (UACMP) WITH MICROSCOPIC - Abnormal; Notable for the following components:      Result Value   APPearance HAZY (*)    Hgb urine dipstick LARGE (*)    Protein, ur 100 (*)    Leukocytes,Ua LARGE (*)    Bacteria, UA FEW (*)    All other components within normal limits  RESP PANEL BY RT-PCR (FLU A&B, COVID) [id number]  URINE CULTURE    EKG   Radiology No results found.  Procedures Procedures (including critical care time)  Medications Ordered in UC Medications - No data to display  Initial Impression / Assessment and Plan / UC Course  I have reviewed the triage vital signs  and the nursing notes.  Pertinent labs & imaging results that were available during my care of the patient were reviewed by me and considered in my medical decision making (see chart for details).  65 year old female presenting with multiple complaints.  First she states she believes she has a UTI.  Complains of urinary frequency, urgency and dysuria for the past couple of days.  Additionally, patient complains of postnasal drainage, body aches, fatigue, headaches and congestion as well as cough for the past couple of days.  Patient's vitals are all normal and stable and she is overall well-appearing.  Exam only significant for mild posterior pharyngeal erythema with clear postnasal drainage.  Urinalysis performed today shows hazy appearance, large blood, large leukocytes, protein, and few bacteria.  We will send urine for culture and treat for UTI at this time with Keflex.  Offered Pyridium but she declines.  Advised her to increase her fluid intake.  ED precautions were UTIs reviewed.  Respiratory panel obtained to assess for possible influenza or COVID-19 given her symptoms.  Patient elected to be called with results if positive.  Advised her that if she does not receive a call that her tests are negative and this is a viral URI.  Supportive care encouraged with increasing rest and fluids and taking over-the-counter Mucinex and using nasal sprays as well as NyQuil for cough at bedtime and Tylenol and Motrin for discomfort.  Reviewed when to return and when to go to ED for cough/cold symptoms.  All negative respiratory panel.  Final Clinical Impressions(s) / UC Diagnoses   Final diagnoses:  Acute cystitis with hematuria  Dysuria  Fatigue, unspecified type  Myalgia  Post-nasal drainage  Cough     Discharge Instructions      UTI: Based on either symptoms  or urinalysis, you may have a urinary tract infection. We will send the urine for culture and call with results in a few days. Begin  antibiotics at this time. Your symptoms should be much improved over the next 2-3 days. Increase rest and fluid intake. If for some reason symptoms are worsening or not improving after a couple of days or the urine culture determines the antibiotics you are taking will not treat the infection, the antibiotics may be changed. Return or go to ER for fever, back pain, worsening urinary pain, discharge, increased blood in urine. May take Tylenol or Motrin OTC for pain relief or consider AZO if no contraindications   URI/COLD SYMPTOMS: Your exam today is consistent with a viral illness. Antibiotics are not indicated at this time. Use medications as directed, including cough syrup, nasal saline, and decongestants. Your symptoms should improve over the next few days and resolve within 7-10 days. Increase rest and fluids. F/u if symptoms worsen or predominate such as sore throat, ear pain, productive cough, shortness of breath, or if you develop high fevers or worsening fatigue over the next several days.    You have received COVID testing today either for positive exposure, concerning symptoms that could be related to COVID infection, screening purposes, or re-testing after confirmed positive.  Your test obtained today checks for active viral infection in the last 1-2 weeks. If your test is negative now, you can still test positive later. So, if you do develop symptoms you should either get re-tested and/or isolate x 5 days and then strict mask use x 5 days (unvaccinated) or mask use x 10 days (vaccinated). Please follow CDC guidelines.  While Rapid antigen tests come back in 15-20 minutes, send out PCR/molecular test results typically come back within 1-3 days. In the mean time, if you are symptomatic, assume this could be a positive test and treat/monitor yourself as if you do have COVID.   We will call with test results if positive. Please download the MyChart app and set up a profile to access test results.    If symptomatic, go home and rest. Push fluids. Take Tylenol as needed for discomfort. Gargle warm salt water. Throat lozenges. Take Mucinex DM or Robitussin for cough. Humidifier in bedroom to ease coughing. Warm showers. Also review the COVID handout for more information.  COVID-19 INFECTION: The incubation period of COVID-19 is approximately 14 days after exposure, with most symptoms developing in roughly 4-5 days. Symptoms may range in severity from mild to critically severe. Roughly 80% of those infected will have mild symptoms. People of any age may become infected with COVID-19 and have the ability to transmit the virus. The most common symptoms include: fever, fatigue, cough, body aches, headaches, sore throat, nasal congestion, shortness of breath, nausea, vomiting, diarrhea, changes in smell and/or taste.    COURSE OF ILLNESS Some patients may begin with mild disease which can progress quickly into critical symptoms. If your symptoms are worsening please call ahead to the Emergency Department and proceed there for further treatment. Recovery time appears to be roughly 1-2 weeks for mild symptoms and 3-6 weeks for severe disease.   GO IMMEDIATELY TO ER FOR FEVER YOU ARE UNABLE TO GET DOWN WITH TYLENOL, BREATHING PROBLEMS, CHEST PAIN, FATIGUE, LETHARGY, INABILITY TO EAT OR DRINK, ETC  QUARANTINE AND ISOLATION: To help decrease the spread of COVID-19 please remain isolated if you have COVID infection or are highly suspected to have COVID infection. This means -stay home and isolate  to one room in the home if you live with others. Do not share a bed or bathroom with others while ill, sanitize and wipe down all countertops and keep common areas clean and disinfected. Stay home for 5 days. If you have no symptoms or your symptoms are resolving after 5 days, you can leave your house. Continue to wear a mask around others for 5 additional days. If you have been in close contact (within 6 feet) of  someone diagnosed with COVID 19, you are advised to quarantine in your home for 14 days as symptoms can develop anywhere from 2-14 days after exposure to the virus. If you develop symptoms, you  must isolate.  Most current guidelines for COVID after exposure -unvaccinated: isolate 5 days and strict mask use x 5 days. Test on day 5 is possible -vaccinated: wear mask x 10 days if symptoms do not develop -You do not necessarily need to be tested for COVID if you have + exposure and  develop symptoms. Just isolate at home x10 days from symptom onset During this global pandemic, CDC advises to practice social distancing, try to stay at least 71ft away from others at all times. Wear a face covering. Wash and sanitize your hands regularly and avoid going anywhere that is not necessary.  KEEP IN MIND THAT THE COVID TEST IS NOT 100% ACCURATE AND YOU SHOULD STILL DO EVERYTHING TO PREVENT POTENTIAL SPREAD OF VIRUS TO OTHERS (WEAR MASK, WEAR GLOVES, WASH HANDS AND SANITIZE REGULARLY). IF INITIAL TEST IS NEGATIVE, THIS MAY NOT MEAN YOU ARE DEFINITELY NEGATIVE. MOST ACCURATE TESTING IS DONE 5-7 DAYS AFTER EXPOSURE.   It is not advised by CDC to get re-tested after receiving a positive COVID test since you can still test positive for weeks to months after you have already cleared the virus.   *If you have not been vaccinated for COVID, I strongly suggest you consider getting vaccinated as long as there are no contraindications.       ED Prescriptions     Medication Sig Dispense Auth. Provider   cephALEXin (KEFLEX) 500 MG capsule Take 1 capsule (500 mg total) by mouth 2 (two) times daily for 7 days. 14 capsule Shirlee Latch, PA-C      PDMP not reviewed this encounter.   Shirlee Latch, PA-C 11/08/20 1147

## 2020-11-08 NOTE — ED Triage Notes (Signed)
Pt c/o dysuria, urinary frequency, cough, headache, body aches, post nasal drip and subjective fever. Started about 2 days ago. She states she took a home test last night and was negative.

## 2020-11-08 NOTE — Discharge Instructions (Addendum)
UTI: Based on either symptoms or urinalysis, you may have a urinary tract infection. We will send the urine for culture and call with results in a few days. Begin antibiotics at this time. Your symptoms should be much improved over the next 2-3 days. Increase rest and fluid intake. If for some reason symptoms are worsening or not improving after a couple of days or the urine culture determines the antibiotics you are taking will not treat the infection, the antibiotics may be changed. Return or go to ER for fever, back pain, worsening urinary pain, discharge, increased blood in urine. May take Tylenol or Motrin OTC for pain relief or consider AZO if no contraindications   URI/COLD SYMPTOMS: Your exam today is consistent with a viral illness. Antibiotics are not indicated at this time. Use medications as directed, including cough syrup, nasal saline, and decongestants. Your symptoms should improve over the next few days and resolve within 7-10 days. Increase rest and fluids. F/u if symptoms worsen or predominate such as sore throat, ear pain, productive cough, shortness of breath, or if you develop high fevers or worsening fatigue over the next several days.    You have received COVID testing today either for positive exposure, concerning symptoms that could be related to COVID infection, screening purposes, or re-testing after confirmed positive.  Your test obtained today checks for active viral infection in the last 1-2 weeks. If your test is negative now, you can still test positive later. So, if you do develop symptoms you should either get re-tested and/or isolate x 5 days and then strict mask use x 5 days (unvaccinated) or mask use x 10 days (vaccinated). Please follow CDC guidelines.  While Rapid antigen tests come back in 15-20 minutes, send out PCR/molecular test results typically come back within 1-3 days. In the mean time, if you are symptomatic, assume this could be a positive test and  treat/monitor yourself as if you do have COVID.   We will call with test results if positive. Please download the MyChart app and set up a profile to access test results.   If symptomatic, go home and rest. Push fluids. Take Tylenol as needed for discomfort. Gargle warm salt water. Throat lozenges. Take Mucinex DM or Robitussin for cough. Humidifier in bedroom to ease coughing. Warm showers. Also review the COVID handout for more information.  COVID-19 INFECTION: The incubation period of COVID-19 is approximately 14 days after exposure, with most symptoms developing in roughly 4-5 days. Symptoms may range in severity from mild to critically severe. Roughly 80% of those infected will have mild symptoms. People of any age may become infected with COVID-19 and have the ability to transmit the virus. The most common symptoms include: fever, fatigue, cough, body aches, headaches, sore throat, nasal congestion, shortness of breath, nausea, vomiting, diarrhea, changes in smell and/or taste.    COURSE OF ILLNESS Some patients may begin with mild disease which can progress quickly into critical symptoms. If your symptoms are worsening please call ahead to the Emergency Department and proceed there for further treatment. Recovery time appears to be roughly 1-2 weeks for mild symptoms and 3-6 weeks for severe disease.   GO IMMEDIATELY TO ER FOR FEVER YOU ARE UNABLE TO GET DOWN WITH TYLENOL, BREATHING PROBLEMS, CHEST PAIN, FATIGUE, LETHARGY, INABILITY TO EAT OR DRINK, ETC  QUARANTINE AND ISOLATION: To help decrease the spread of COVID-19 please remain isolated if you have COVID infection or are highly suspected to have COVID infection. This means -  stay home and isolate to one room in the home if you live with others. Do not share a bed or bathroom with others while ill, sanitize and wipe down all countertops and keep common areas clean and disinfected. Stay home for 5 days. If you have no symptoms or your symptoms  are resolving after 5 days, you can leave your house. Continue to wear a mask around others for 5 additional days. If you have been in close contact (within 6 feet) of someone diagnosed with COVID 19, you are advised to quarantine in your home for 14 days as symptoms can develop anywhere from 2-14 days after exposure to the virus. If you develop symptoms, you  must isolate.  Most current guidelines for COVID after exposure -unvaccinated: isolate 5 days and strict mask use x 5 days. Test on day 5 is possible -vaccinated: wear mask x 10 days if symptoms do not develop -You do not necessarily need to be tested for COVID if you have + exposure and  develop symptoms. Just isolate at home x10 days from symptom onset During this global pandemic, CDC advises to practice social distancing, try to stay at least 6ft away from others at all times. Wear a face covering. Wash and sanitize your hands regularly and avoid going anywhere that is not necessary.  KEEP IN MIND THAT THE COVID TEST IS NOT 100% ACCURATE AND YOU SHOULD STILL DO EVERYTHING TO PREVENT POTENTIAL SPREAD OF VIRUS TO OTHERS (WEAR MASK, WEAR GLOVES, WASH HANDS AND SANITIZE REGULARLY). IF INITIAL TEST IS NEGATIVE, THIS MAY NOT MEAN YOU ARE DEFINITELY NEGATIVE. MOST ACCURATE TESTING IS DONE 5-7 DAYS AFTER EXPOSURE.   It is not advised by CDC to get re-tested after receiving a positive COVID test since you can still test positive for weeks to months after you have already cleared the virus.   *If you have not been vaccinated for COVID, I strongly suggest you consider getting vaccinated as long as there are no contraindications.   

## 2020-11-11 LAB — URINE CULTURE: Culture: >=100000 — AB

## 2021-01-30 ENCOUNTER — Other Ambulatory Visit: Payer: Self-pay | Admitting: Family Medicine

## 2021-01-30 DIAGNOSIS — Z780 Asymptomatic menopausal state: Secondary | ICD-10-CM
